# Patient Record
Sex: Female | Born: 2000 | Race: White | Hispanic: No | Marital: Single | Smoking: Never smoker
Health system: Southern US, Community
[De-identification: ages and names within clinical notes are randomized; demographics above are authoritative.]

---

## 2018-11-08 ENCOUNTER — Emergency Department (HOSPITAL_COMMUNITY): Payer: 59

## 2018-11-08 ENCOUNTER — Encounter (HOSPITAL_COMMUNITY): Payer: Self-pay | Admitting: Emergency Medicine

## 2018-11-08 ENCOUNTER — Emergency Department (HOSPITAL_COMMUNITY)
Admission: EM | Admit: 2018-11-08 | Discharge: 2018-11-08 | Disposition: A | Discharge status: Home / Self Care | Payer: 59 | Attending: Emergency Medicine | Admitting: Emergency Medicine

## 2018-11-08 ENCOUNTER — Other Ambulatory Visit: Payer: Self-pay

## 2018-11-08 DIAGNOSIS — S52135A Nondisplaced fracture of neck of left radius, initial encounter for closed fracture: Secondary | ICD-10-CM | POA: Diagnosis not present

## 2018-11-08 DIAGNOSIS — Y92331 Roller skating rink as the place of occurrence of the external cause: Secondary | ICD-10-CM | POA: Diagnosis not present

## 2018-11-08 DIAGNOSIS — Y9351 Activity, roller skating (inline) and skateboarding: Secondary | ICD-10-CM | POA: Insufficient documentation

## 2018-11-08 DIAGNOSIS — Y998 Other external cause status: Secondary | ICD-10-CM | POA: Diagnosis not present

## 2018-11-08 DIAGNOSIS — W19XXXA Unspecified fall, initial encounter: Secondary | ICD-10-CM

## 2018-11-08 DIAGNOSIS — S59902A Unspecified injury of left elbow, initial encounter: Secondary | ICD-10-CM | POA: Diagnosis present

## 2018-11-08 MED ORDER — IBUPROFEN 800 MG PO TABS
800.0000 mg | ORAL_TABLET | Freq: Once | ORAL | Status: AC
  Administered 2018-11-08: 800 mg via ORAL
  Filled 2018-11-08: qty 1

## 2018-11-08 MED ORDER — OXYCODONE-ACETAMINOPHEN 5-325 MG PO TABS
1.0000 | ORAL_TABLET | Freq: Once | ORAL | Status: AC
  Administered 2018-11-08: 1 via ORAL
  Filled 2018-11-08: qty 1

## 2018-11-08 MED ORDER — IBUPROFEN 800 MG PO TABS: 800.0000 mg | ORAL_TABLET | Freq: Three times a day (TID) | ORAL | 0 refills | Status: AC

## 2018-11-08 MED ORDER — OXYCODONE-ACETAMINOPHEN 5-325 MG PO TABS: 1.0000 | ORAL_TABLET | Freq: Four times a day (QID) | ORAL | 0 refills | Status: AC | PRN

## 2018-11-08 NOTE — ED Triage Notes (Signed)
Patient presents with a right arm injury. Patient states she was skating when she fell backwards and caught herself with her left arm. Patient complaining of forearm pain and elbow pain with inability to straighten her arm. PMS intact.

## 2018-11-08 NOTE — ED Notes (Signed)
PT DISCHARGED. INSTRUCTIONS AND PRESCRIPTIONS GIVEN. AAOX4. PT IN NO APPARENT DISTRESS WITH MODERATE PAIN. THE OPPORTUNITY TO ASK QUESTIONS WAS PROVIDED. 

## 2018-11-08 NOTE — ED Provider Notes (Signed)
[facility]-EMERGENCY DEPT Provider Note   CSN: [id number] Arrival date & time: 11/08/18  0447     History   Chief Complaint Chief Complaint  Patient presents with  . Arm Injury    HPI Virginia Torres is a 17 y.o. female without significant past medical hx who presents to the ED s/p mechanical fall last night around 20:00 with LUE pain. Patient was roller skating when she lost her footing and fell backwards. Denies prodromal dizziness, lightheadedness, chest pain or dyspnea. She fell onto bilateral hands to break the fall and then onto the elbow/forearm area, more so to the L side. Denies head injury or LOC. Having pain primarily to the L elbow and also to the forearm and the hand. Current pain is mild at rest, worsens to 8/10 in severity with movement, no alleviating factors. No intervention PTA. Denies headache, neck pain, numbness, weakness, or paresthesias. Patient is R hand dominant.   HPI  History reviewed. No pertinent past medical history.  There are no active problems to display for this patient.   History reviewed. No pertinent surgical history.   OB History   None      Home Medications    Prior to Admission medications   Not on File    Family History No family history on file.  Social History Social History   Tobacco Use  . Smoking status: Never Smoker  . Smokeless tobacco: Never Used  Substance Use Topics  . Alcohol use: Yes  . Drug use: Not Currently     Allergies   Patient has no known allergies.   Review of Systems Review of Systems  Constitutional: Negative for chills and fever.  Respiratory: Negative for shortness of breath.   Cardiovascular: Negative for chest pain.  Musculoskeletal: Negative for back pain and neck pain.       Positive for pain to the L elbow/L forearm/L hand.   Neurological: Negative for dizziness, syncope, weakness, light-headedness, numbness and headaches.     Physical Exam Updated Vital  Signs BP 129/77   Pulse 80   Temp 98.2 F (36.8 C)   Resp 18   LMP 10/24/2018 (Exact Date)   SpO2 97%   Physical Exam  Constitutional: She appears well-developed and well-nourished.  Non-toxic appearance. No distress.  HENT:  Head: Normocephalic and atraumatic. Head is without raccoon's eyes and without Battle's sign.  Right Ear: No hemotympanum.  Left Ear: No hemotympanum.  Nose: No rhinorrhea.  Mouth/Throat: Uvula is midline and oropharynx is clear and moist.  Eyes: Pupils are equal, round, and reactive to light. Conjunctivae and EOM are normal. Right eye exhibits no discharge. Left eye exhibits no discharge.  Neck: Normal range of motion. No spinous process tenderness present.  Cardiovascular: Normal rate and regular rhythm.  Pulses:      Radial pulses are 2+ on the right side, and 2+ on the left side.  Pulmonary/Chest: Effort normal and breath sounds normal.  Abdominal: Soft. There is no tenderness.  Musculoskeletal:  No obvious deformity, erythema, ecchymosis, or open wounds.  Upper extremities: Mild swelling noted to the L elbow. Patient has full AROM to bilateral shoulders, elbows, wrists, and all IP/MCP joints. Some discomfort with all ROM of the L elbow, but intact. She is tender diffusely to posterior L elbow including olecranon and medial/lateral epicondyles. There is tenderness over the L radial head as well. She is also tender over the L mid forearm and the 3-5th L metacarpals minimally. Otherwise upper extremities are  nontender. No clavicular tenderness. No anatomical snuffbox tenderness.  Back: No midline cervical, thoracic, or lumbar spinal tenderness. No palpable step off.  Lower extremities: Normal AROM. Non tender.   Neurological: She is alert.  Clear speech. Sensation grossly intact x 4. 5/5 symmetric strength. 5/5 strength with plantar/dorsiflexion bilaterally. Ambulatory. Able to perform OK sign, thumbs up, and cross 2nd/3rd digits.   Psychiatric: She has a normal  mood and affect. Her behavior is normal. Thought content normal.  Nursing note and vitals reviewed.    ED Treatments / Results  Labs (all labs ordered are listed, but only abnormal results are displayed) Labs Reviewed - No data to display  EKG None  Radiology Dg Elbow Complete Left  Result Date: 11/08/2018 CLINICAL DATA:  Pain after fall while skating. EXAM: LEFT ELBOW - COMPLETE 3+ VIEW COMPARISON:  None. FINDINGS: There is probably a mildly impacted fracture of the radial neck without articular involvement. Associated left elbow effusion with elevation of anterior and posterior fat pads. Distal humerus and proximal ulna appear intact. No dislocation at the elbow joint. IMPRESSION: Left elbow effusion with probable impacted fracture of the left radial neck. Electronically Signed   By: Burman NievesWilliam  Stevens M.D.   On: 11/08/2018 06:05   Dg Forearm Left  Result Date: 11/08/2018 CLINICAL DATA:  Forearm pain and unable to straighten the left arm after fall while skating. EXAM: LEFT FOREARM - 2 VIEW COMPARISON:  None. FINDINGS: There is no evidence of fracture or other focal bone lesions. Soft tissues are unremarkable. IMPRESSION: Negative. Electronically Signed   By: Burman NievesWilliam  Stevens M.D.   On: 11/08/2018 06:03   Dg Hand Complete Left  Result Date: 11/08/2018 CLINICAL DATA:  Pain after a fall while skating. EXAM: LEFT HAND - COMPLETE 3+ VIEW COMPARISON:  None. FINDINGS: There is no evidence of fracture or dislocation. There is no evidence of arthropathy or other focal bone abnormality. Soft tissues are unremarkable. IMPRESSION: Negative. Electronically Signed   By: Burman NievesWilliam  Stevens M.D.   On: 11/08/2018 06:06    Procedures Procedures (including critical care time)  SPLINT APPLICATION Date/Time: 6:39 AM Authorized by: Harvie HeckSamantha Mithra Spano Consent: Verbal consent obtained. Risks and benefits: risks, benefits and alternatives were discussed Consent given by: patient Splint applied by:  orthopedic technician Location details: LUE Splint type: Long arm sugar tong Post-procedure: The splinted body part was neurovascularly unchanged following the procedure. Patient tolerance: Patient tolerated the procedure well with no immediate complications.  Medications Ordered in ED Medications - No data to display   Initial Impression / Assessment and Plan / ED Course  I have reviewed the triage vital signs and the nursing notes.  Pertinent labs & imaging results that were available during my care of the patient were reviewed by me and considered in my medical decision making (see chart for details).   Patient presents to the ED s/p mechanical fall with complaints of LUE pain. Patient nontoxic appearing, in no apparent distress, vitals WNL. . Patient without signs of serious head, neck, or back injury. Canadian CT head injury/trauma rule and C-spine rule suggest no imaging required. Patient has no focal neurologic deficits or point/focal midline spinal tenderness to palpation, doubt fracture or dislocation of the spine, doubt head bleed. Patient having pain to the L elbow/forearm/hand. No obvious deformity or open wounds. ROM intact. Tender to posterior elbow, radial head, mid forearm, and metacarpals 3-5. No snuffbox tenderness. Xrays notable for left elbow effusion with probable impacted fracture of the left radial neck, NVI distally.  Discussed with supervising physician Dr. Eudelia Bunch, we personally reviewed imaging, recommends long arm sugar tong with orthopedics follow up. Splint per procedure above. Recommended motrin and PRICE, a few tablets of percocet provided for severe pain, [employer] Controlled Substance reporting System queried. Orthopedics follow up. I discussed results, treatment plan, need for follow-up, and return precautions with the patient. Provided opportunity for questions, patient confirmed understanding and is in agreement with plan.   Final Clinical Impressions(s) / ED  Diagnoses   Final diagnoses:  Fall, initial encounter  Closed nondisplaced fracture of neck of left radius, initial encounter    ED Discharge Orders         Ordered    ibuprofen (ADVIL,MOTRIN) 800 MG tablet  3 times daily     11/08/18 0651    oxyCODONE-acetaminophen (PERCOCET/ROXICET) 5-325 MG tablet  Every 6 hours PRN     11/08/18 0651           Cherly Anderson, PA-C 11/08/18 0653    Nira Conn, MD 11/08/18 [phone]

## 2018-11-08 NOTE — Discharge Instructions (Signed)
Please read and follow all provided instructions.  You have been seen today for a fall with a left elbow injury.  The x-rays that we did shows any concerning for a fracture to the radius and the elbow.  For this reason we are placing you into a splint-keep the splint on until you have followed up with orthopedics.  Please keep the splint clean and dry.  Please place a plastic bag or cast cover over top of the splint when you need to shower.  We would like you to follow PRICE instructions as below.  We are sending you home with medications to help with pain.  Please call the orthopedics office provided your discharge instructions on Monday to set up an appointment in the next 1 week.  Home care instructions: -- *PRICE in the first 24-48 hours after injury: Protect (with brace, splint, sling), if given by your provider Rest Ice- Do not apply ice pack directly to your skin, place towel or similar between your skin and ice/ice pack. Apply ice for 20 min, then remove for 40 min while awake Compression- Wear brace, elastic bandage, splint as directed by your provider Elevate affected extremity above the level of your heart when not walking around for the first 24-48 hours   Medications:  - Ibuprofen is a nonsteroidal anti-inflammatory medication that will help with pain and swelling. Be sure to take this medication as prescribed with food, 1 pill every 8 hours,  It should be taken with food, as it can cause stomach upset, and more seriously, stomach bleeding. Do not take other nonsteroidal anti-inflammatory medications with this such as Advil, naproxen, Aleve, Mobic, Goodie Powder, etc.     -Percocet-this is a narcotic/controlled substance medication that has potential addicting qualities.  We recommend that you take 1-2 tablets every 6 hours as needed for severe pain.  Do not drive or operate heavy machinery when taking this medicine as it can be sedating. Do not drink alcohol or take other sedating  medications when taking this medicine for safety reasons.  Keep this out of reach of small children.  Please be aware this medicine has Tylenol in it (325 mg/tab) do not exceed the maximum dose of Tylenol in a day per over the counter recommendations should you decide to supplement with Tylenol over the counter.   We have prescribed you new medication(s) today. Discuss the medications prescribed today with your pharmacist as they can have adverse effects and interactions with your other medicines including over the counter and prescribed medications. Seek medical evaluation if you start to experience new or abnormal symptoms after taking one of these medicines, seek care immediately if you start to experience difficulty breathing, feeling of your throat closing, facial swelling, or rash as these could be indications of a more serious allergic reaction  Follow-up instructions: Please call the orthopedic office for an appointment to be scheduled for sometime within the next 1 week.   Return instructions:  Please return if your digits or extremity are numb or tingling, appear gray or blue, or you have severe pain (also elevate the extremity and loosen splint or wrap if you were given one) Please return if you have redness or fevers.  Please return to the Emergency Department if you experience worsening symptoms.  Please return if you have any other emergent concerns. Additional Information:  Your vital signs today were: BP 129/77    Pulse 80    Temp 98.2 F (36.8 C)    Resp 18  LMP 10/24/2018 (Exact Date)    SpO2 97%  If your blood pressure (BP) was elevated above 135/85 this visit, please have this repeated by your doctor within one month. ---------------

## 2018-11-11 ENCOUNTER — Ambulatory Visit (INDEPENDENT_AMBULATORY_CARE_PROVIDER_SITE_OTHER): Payer: No Typology Code available for payment source | Admitting: Orthopaedic Surgery

## 2018-11-11 ENCOUNTER — Encounter (INDEPENDENT_AMBULATORY_CARE_PROVIDER_SITE_OTHER): Payer: Self-pay | Admitting: Orthopaedic Surgery

## 2018-11-11 VITALS — BP 123/60 | HR 67 | Ht 72.0 in | Wt 215.0 lb

## 2018-11-11 DIAGNOSIS — S52135A Nondisplaced fracture of neck of left radius, initial encounter for closed fracture: Secondary | ICD-10-CM | POA: Diagnosis not present

## 2018-11-11 NOTE — Progress Notes (Signed)
   Office Visit Note   Patient: Virginia Torres           Date of Birth: Jul 07, 2001           MRN: [id number] Visit Date: 11/11/2018              Requested by: No referring provider defined for this encounter. PCP: Patient, No Pcp Per   Assessment & Plan: Visit Diagnoses:  1. Closed nondisplaced fracture of neck of left radius, initial encounter     Plan: Continue splint until next Tuesday she can remove it work on elbow range of motion.  I will recheck her in 2 weeks no x-ray needed on return visit.  We reviewed x-rays today with patient also her friend and discussed elbow range of motion exercises after her splint is removed next Tuesday.  Follow-Up Instructions: Return in about 2 weeks (around 11/25/2018).   Orders:  No orders of the defined types were placed in this encounter.  No orders of the defined types were placed in this encounter.     Procedures: No procedures performed   Clinical Data: No additional findings.   Subjective: Chief Complaint  Patient presents with  . 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  She is placed in a splint with sling.  She is right-hand dominant she is been using ibuprofen for pain.  She is noticed some stiffness in her fingers but no numbness or tingling.  No past history of injury to her elbow.  Review of Systems patient's active and 14 point review of systems is negative is a pertains HPI.   Objective: Vital Signs: BP 123/60   Pulse 67   Ht 6' (1.829 m)   Wt 215 lb (97.5 kg)   LMP 10/24/2018 (Exact Date)   BMI 29.16 kg/m   Physical Exam  Constitutional: She is oriented to person, place, and time. She appears well-developed.  HENT:  Head: Normocephalic.  Right Ear: External ear normal.  Left Ear: External ear normal.  Eyes: Pupils are equal, round, and  reactive to light.  Neck: No tracheal deviation present. No thyromegaly present.  Cardiovascular: Normal rate.  Pulmonary/Chest: Effort normal.  Abdominal: Soft.  Neurological: She is alert and oriented to person, place, and time.  Skin: Skin is warm and dry.  Psychiatric: She has a normal mood and affect. Her behavior is normal.    Ortho Exam patient has tenderness over the radial head.  Intact EPL finger extension.  Trace finger swelling noted but she can touch fingertips to distal palmar crease.  No tenderness over the shoulder.  Specialty Comments:  No specialty comments available.  Imaging: ER x-rays reviewed with patient which demonstrates radial neck fracture without significant impaction.   PMFS History: There are no active problems to display for this patient.  No past medical history on file.  No family history on file.  No past surgical history on file. Social History   Occupational History  . Not on file  Tobacco Use  . Smoking status: Never Smoker  . Smokeless tobacco: Never Used  Substance and Sexual Activity  . Alcohol use: Yes  . Drug use: Not Currently  . Sexual activity: Not on file

## 2018-11-26 ENCOUNTER — Ambulatory Visit (INDEPENDENT_AMBULATORY_CARE_PROVIDER_SITE_OTHER): Payer: No Typology Code available for payment source | Admitting: Orthopaedic Surgery

## 2018-11-26 ENCOUNTER — Encounter (INDEPENDENT_AMBULATORY_CARE_PROVIDER_SITE_OTHER): Payer: Self-pay | Admitting: Orthopaedic Surgery

## 2018-11-26 VITALS — BP 143/85 | HR 83

## 2018-11-26 DIAGNOSIS — S52135D Nondisplaced fracture of neck of left radius, subsequent encounter for closed fracture with routine healing: Secondary | ICD-10-CM

## 2018-11-26 NOTE — Progress Notes (Signed)
   Office Visit Note   Patient: Virginia KohutLindsay Pilz           Date of Birth: [birth year]           MRN: [id number] Visit Date: 11/26/2018              Requested by: No referring provider defined for this encounter. PCP: Patient, No Pcp Per   Assessment & Plan: Visit Diagnoses:  1. Closed nondisplaced fracture of neck of left radius with routine healing, subsequent encounter     Plan: Patient has symmetrical elbow range of motion minimal discomfort.  We will release her from care and follow-up as needed.  Follow-Up Instructions: Return if symptoms worsen or fail to improve.   Orders:  No orders of the defined types were placed in this encounter.  No orders of the defined types were placed in this encounter.     Procedures: No procedures performed   Clinical Data: No additional findings.   Subjective: Chief Complaint  Patient presents with  . Left Elbow - Pain, Follow-up    HPI 17 year old female returns 2 weeks post fall while rollerskating with nondisplaced radial neck fracture.  She has near full range of motion she can touch her thumb to her shoulder.  She lacks 5 degrees reaching full extension on her left elbow but also has same range of motion her opposite right elbow which does not reach full extension.  Occasion with her elbow out straight she had some numbness and tingling in her fingers.  Patient's been using ibuprofen for discomfort.  Review of Systems updated unchanged from 11/11/2018 office visit   Objective: Vital Signs: BP (!) 143/85   Pulse 83   Physical Exam  Constitutional: She is oriented to person, place, and time. She appears well-developed.  HENT:  Head: Normocephalic.  Right Ear: External ear normal.  Left Ear: External ear normal.  Eyes: Pupils are equal, round, and reactive to light.  Neck: No tracheal deviation present. No thyromegaly present.  Cardiovascular: Normal rate.  Pulmonary/Chest: Effort normal.  Abdominal: Soft.  Neurological:  She is alert and oriented to person, place, and time.  Skin: Skin is warm and dry.  Psychiatric: She has a normal mood and affect. Her behavior is normal.    Ortho Exam elbow range of motion as listed above no tenderness over the radial head.  Median ulnar motor is intact.  Is not using her sling.  Normal sensation radial sensory to the hand.  Specialty Comments:  No specialty comments available.  Imaging: No results found.   PMFS History: Patient Active Problem List   Diagnosis Date Noted  . Closed nondisplaced fracture of neck of left radius 11/11/2018   History reviewed. No pertinent past medical history.  History reviewed. No pertinent family history.  History reviewed. No pertinent surgical history. Social History   Occupational History  . Not on file  Tobacco Use  . Smoking status: Never Smoker  . Smokeless tobacco: Never Used  Substance and Sexual Activity  . Alcohol use: Yes  . Drug use: Not Currently  . Sexual activity: Not on file

## 2019-08-20 IMAGING — CR DG FOREARM 2V*L*
2 series · 2 of 2 positions shown · non-contrast
Comparison: None.

CLINICAL DATA: Forearm pain and unable to straighten the left arm
after fall while skating.

EXAM:
LEFT FOREARM - 2 VIEW

[x forearm ap left]
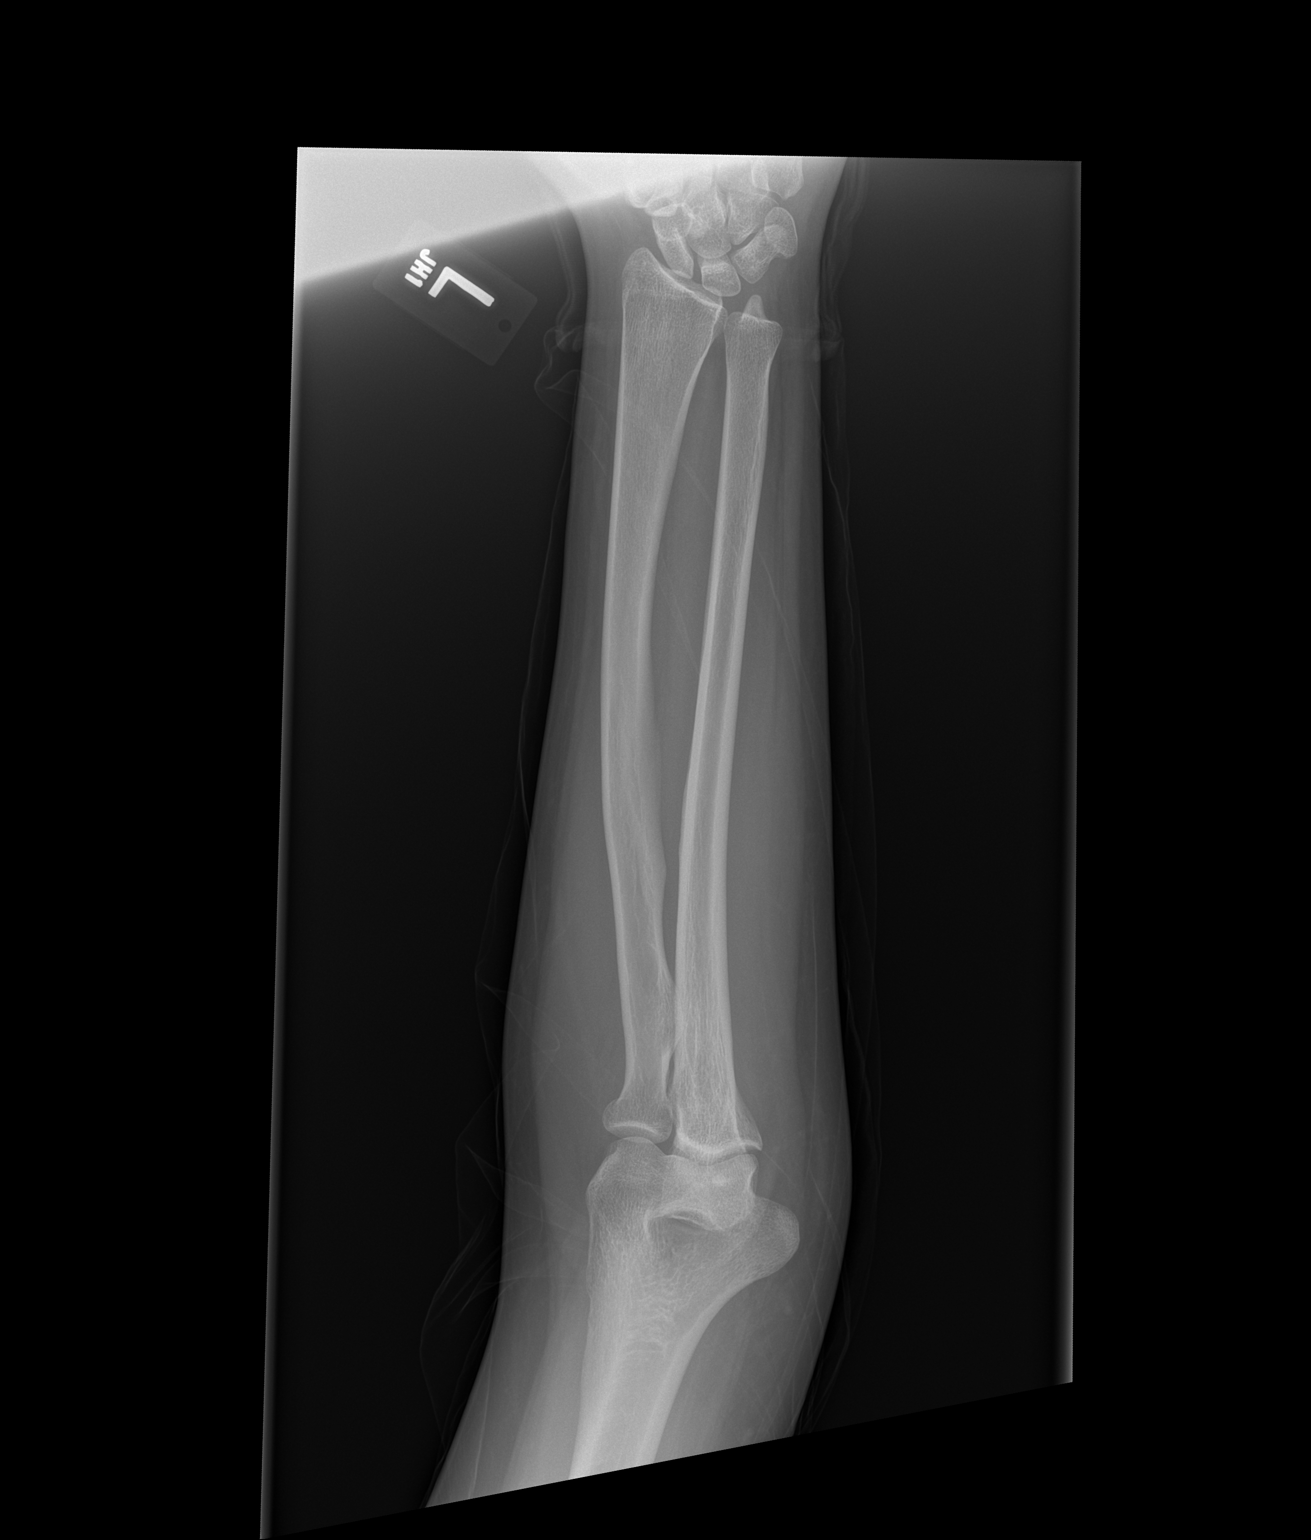

[x forearm lat left]
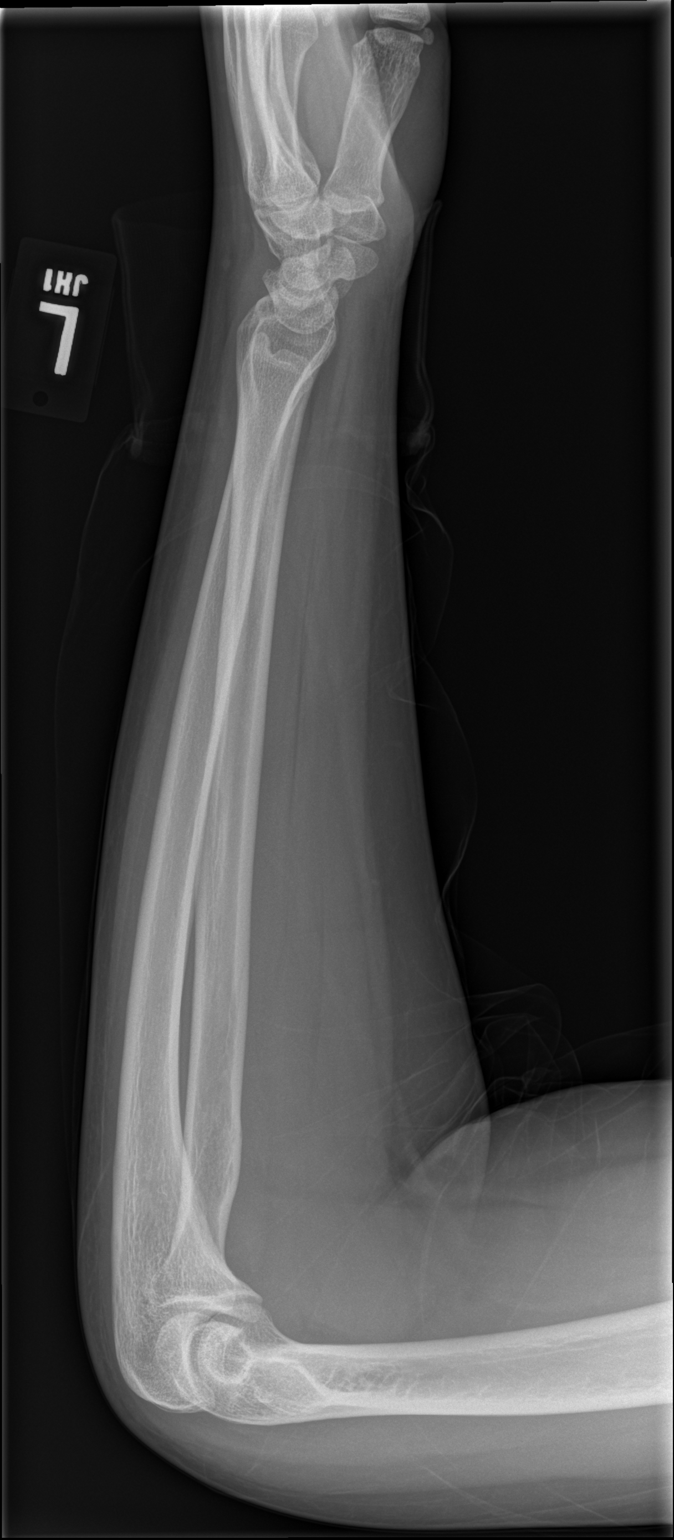

[2 of 2 positions shown; findings below may reference images not displayed]

FINDINGS: There is no evidence of fracture or other focal bone lesions. Soft
tissues are unremarkable.
IMPRESSION: Negative.

## 2019-11-09 ENCOUNTER — Other Ambulatory Visit: Payer: Self-pay

## 2019-11-09 DIAGNOSIS — Z20822 Contact with and (suspected) exposure to covid-19: Secondary | ICD-10-CM

## 2019-11-10 LAB — NOVEL CORONAVIRUS, NAA: SARS-CoV-2, NAA: NOT DETECTED

## 2020-02-27 ENCOUNTER — Ambulatory Visit: Payer: No Typology Code available for payment source | Attending: Internal Medicine

## 2020-02-27 DIAGNOSIS — Z23 Encounter for immunization: Secondary | ICD-10-CM

## 2020-02-27 NOTE — Progress Notes (Signed)
   Covid-19 Vaccination Clinic  Name:  Virginia Torres    MRN: [id number] DOB: [birth year]  02/27/2020  [name] was observed post Covid-19 immunization for 15 minutes without incident. She was provided with Vaccine Information Sheet and instruction to access the V-Safe system.   [name] was instructed to call 911 with any severe reactions post vaccine: Marland Kitchen Difficulty breathing  . Swelling of face and throat  . A fast heartbeat  . A bad rash all over body  . Dizziness and weakness   Immunizations Administered    Name Date Dose VIS Date Route   Pfizer COVID-19 Vaccine 02/27/2020  4:31 PM 0.3 mL 11/27/2019 Intramuscular   Manufacturer: [employer], [employer]   Lot: [id number]   NDC: [address]-0

## 2020-03-22 ENCOUNTER — Ambulatory Visit: Payer: No Typology Code available for payment source

## 2020-03-22 ENCOUNTER — Ambulatory Visit: Payer: No Typology Code available for payment source | Attending: Internal Medicine

## 2020-03-22 DIAGNOSIS — Z23 Encounter for immunization: Secondary | ICD-10-CM

## 2020-03-22 NOTE — Progress Notes (Signed)
   Covid-19 Vaccination Clinic  Name:  [name]    MRN: [id number] DOB: [birth year]  03/22/2020  [name] was observed post Covid-19 immunization for 15 minutes without incident. She was provided with Vaccine Information Sheet and instruction to access the V-Safe system.   [name] was instructed to call 911 with any severe reactions post vaccine: Marland Kitchen Difficulty breathing  . Swelling of face and throat  . A fast heartbeat  . A bad rash all over body  . Dizziness and weakness   Immunizations Administered    Name Date Dose VIS Date Route   Pfizer COVID-19 Vaccine 03/22/2020  2:35 PM 0.3 mL 11/27/2019 Intramuscular   Manufacturer: [employer], [employer]   Lot: [id number]   NDC: [address]-0
